# Patient Record
Sex: Female | Born: 1995 | Race: White | Hispanic: No | Marital: Single | Smoking: Current every day smoker
Health system: Southern US, Community
[De-identification: ages and names within clinical notes are randomized; demographics above are authoritative.]

---
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
## 2014-10-23 ENCOUNTER — Emergency Department (HOSPITAL_COMMUNITY): Payer: Managed Care, Other (non HMO)

## 2014-10-23 ENCOUNTER — Emergency Department (HOSPITAL_COMMUNITY)
Admission: EM | Admit: 2014-10-23 | Discharge: 2014-10-23 | Disposition: A | Discharge status: Home / Self Care | Payer: Managed Care, Other (non HMO) | Attending: Emergency Medicine | Admitting: Emergency Medicine

## 2014-10-23 ENCOUNTER — Encounter (HOSPITAL_COMMUNITY): Payer: Self-pay | Admitting: Emergency Medicine

## 2014-10-23 DIAGNOSIS — R1909 Other intra-abdominal and pelvic swelling, mass and lump: Secondary | ICD-10-CM

## 2014-10-23 DIAGNOSIS — K4090 Unilateral inguinal hernia, without obstruction or gangrene, not specified as recurrent: Secondary | ICD-10-CM | POA: Insufficient documentation

## 2014-10-23 DIAGNOSIS — Z720 Tobacco use: Secondary | ICD-10-CM | POA: Diagnosis not present

## 2014-10-23 DIAGNOSIS — Z3202 Encounter for pregnancy test, result negative: Secondary | ICD-10-CM | POA: Insufficient documentation

## 2014-10-23 LAB — URINALYSIS, ROUTINE W REFLEX MICROSCOPIC
Bilirubin Urine: NEGATIVE
Glucose, UA: NEGATIVE mg/dL
Hgb urine dipstick: NEGATIVE
Ketones, ur: NEGATIVE mg/dL
LEUKOCYTES UA: NEGATIVE
Nitrite: NEGATIVE
PROTEIN: NEGATIVE mg/dL
SPECIFIC GRAVITY, URINE: 1.015 (ref 1.005–1.030)
UROBILINOGEN UA: 0.2 mg/dL (ref 0.0–1.0)
pH: 7.5 (ref 5.0–8.0)

## 2014-10-23 LAB — BASIC METABOLIC PANEL
Anion gap: 7 (ref 5–15)
BUN: 6 mg/dL (ref 6–20)
CHLORIDE: 104 mmol/L (ref 101–111)
CO2: 26 mmol/L (ref 22–32)
Calcium: 9 mg/dL (ref 8.9–10.3)
Creatinine, Ser: 0.69 mg/dL (ref 0.44–1.00)
GFR calc non Af Amer: >60 mL/min (ref >60)
Glucose, Bld: 86 mg/dL (ref 65–99)
POTASSIUM: 3.7 mmol/L (ref 3.5–5.1)
SODIUM: 137 mmol/L (ref 135–145)

## 2014-10-23 LAB — CBC WITH DIFFERENTIAL/PLATELET
BASOS PCT: 0 %
Basophils Absolute: 0 10*3/uL (ref 0.0–0.1)
EOS ABS: 0.1 10*3/uL (ref 0.0–0.7)
Eosinophils Relative: 2 %
HEMATOCRIT: 43.1 % (ref 36.0–46.0)
HEMOGLOBIN: 15 g/dL (ref 12.0–15.0)
LYMPHS ABS: 2.4 10*3/uL (ref 0.7–4.0)
Lymphocytes Relative: 34 %
MCH: 33 pg (ref 26.0–34.0)
MCHC: 34.8 g/dL (ref 30.0–36.0)
MCV: 94.7 fL (ref 78.0–100.0)
MONOS PCT: 10 %
Monocytes Absolute: 0.7 10*3/uL (ref 0.1–1.0)
NEUTROS ABS: 3.8 10*3/uL (ref 1.7–7.7)
NEUTROS PCT: 54 %
Platelets: 254 10*3/uL (ref 150–400)
RBC: 4.55 MIL/uL (ref 3.87–5.11)
RDW: 12.4 % (ref 11.5–15.5)
WBC: 7 10*3/uL (ref 4.0–10.5)

## 2014-10-23 LAB — PREGNANCY, URINE: PREG TEST UR: NEGATIVE

## 2014-10-23 MED ORDER — IOHEXOL 300 MG/ML  SOLN
25.0000 mL | Freq: Once | INTRAMUSCULAR | Status: AC | PRN
  Administered 2014-10-23: 25 mL via ORAL

## 2014-10-23 MED ORDER — SODIUM CHLORIDE 0.9 % IV BOLUS (SEPSIS)
500.0000 mL | Freq: Once | INTRAVENOUS | Status: AC
  Administered 2014-10-23: 500 mL via INTRAVENOUS

## 2014-10-23 MED ORDER — ONDANSETRON HCL 4 MG/2ML IJ SOLN
4.0000 mg | Freq: Once | INTRAMUSCULAR | Status: AC
  Administered 2014-10-23: 4 mg via INTRAVENOUS
  Filled 2014-10-23: qty 2

## 2014-10-23 MED ORDER — MORPHINE SULFATE (PF) 4 MG/ML IV SOLN
4.0000 mg | Freq: Once | INTRAVENOUS | Status: AC
  Administered 2014-10-23: 4 mg via INTRAVENOUS
  Filled 2014-10-23: qty 1

## 2014-10-23 MED ORDER — OXYCODONE-ACETAMINOPHEN 5-325 MG PO TABS: 1.0000 | ORAL_TABLET | Freq: Four times a day (QID) | ORAL | Status: AC | PRN

## 2014-10-23 MED ORDER — IOHEXOL 300 MG/ML  SOLN
100.0000 mL | Freq: Once | INTRAMUSCULAR | Status: AC | PRN
  Administered 2014-10-23: 100 mL via INTRAVENOUS

## 2014-10-23 NOTE — ED Notes (Signed)
Pt's discharge instructions reviewed with pt and friend. Pt verbalized understanding, teach back method used. Prescriptions reviewed. All questions answered. Pt discharged home, VSS and pt is ambulatory.

## 2014-10-23 NOTE — ED Notes (Signed)
Know hernia for last five years but pain has increased over the last four days.  Rates pain 10/10.  Did not take any pain medication today.

## 2014-10-23 NOTE — Discharge Instructions (Signed)
Follow-up with general surgery. Phone number given. Medications for pain.

## 2014-10-23 NOTE — ED Notes (Signed)
Pt c/o increased left inguinal hernia  "tearing" pain. Pt reports she has had this hernia for 5 years and was told then to have surgery to fix it. Pt says it wasn't causing her any pain so therefore she didn't have the surgery. Reports increased swelling and pain in the left groin area for the past several days. Pt says she has been unable to sleep or lay down due to the pain. Pt reports sitting up helps her pain.

## 2014-10-23 NOTE — ED Provider Notes (Signed)
CSN: [id number]     Arrival date & time 10/23/14  1227 History  By signing my name below, I, Marica Otter, attest that this documentation has been prepared under the direction and in the presence of Donnetta Hutching, MD. Electronically Signed: Marica Otter, ED Scribe. 10/23/2014. 1:05 PM.   Chief Complaint  Patient presents with  . Hernia   HPI PCP: No PCP Per Patient HPI Comments: Holly Frey is a 19 y.o. female who presents to the Emergency Department complaining of chronic hernia originally onset 5 years ago with swelling and pain worsening over the past four days. Pt rates her current pain a 10/10. Pt [facility] Pt further notes she had a surgery consult 4 years ago to remove the hernia, however, because the hernia was asymptomatic at that time she did not have the surgery. Pt denies vomiting, change in appetite, Hx of incision of hernia site.  No past medical history on file. No past surgical history on file. No family history on file. Social History  Substance Use Topics  . Smoking status: Current Every Day Smoker -- 0.50 packs/day    Types: Cigarettes  . Smokeless tobacco: Not on file  . Alcohol Use: No   OB History    No data available     Review of Systems  Constitutional: Negative for fever and appetite change.  Gastrointestinal: Negative for vomiting.       Hernia in lower abd with associated pain     A complete 10 system review of systems was obtained and all systems are negative except as noted in the HPI and PMH.  Allergies  Review of patient's allergies indicates no known allergies.  Home Medications   Prior to Admission medications   Medication Sig Start Date End Date Taking? Authorizing Provider  levonorgestrel (MIRENA) 20 MCG/24HR IUD 1 each by Intrauterine route once.   Yes Historical Provider, MD  oxyCODONE-acetaminophen (PERCOCET/ROXICET) 5-325 MG tablet Take 1-2 tablets by mouth every 6 (six) hours as  needed. 10/23/14   Donnetta Hutching, MD   Triage Vitals: BP 132/101 mmHg  Pulse 65  Temp(Src) 98 F (36.7 C) (Oral)  Resp 14  Ht  (1.702 m)  Wt 140 lb (63.504 kg)  BMI 21.92 kg/m2  SpO2 100% Physical Exam  Constitutional: She is oriented to person, place, and time. She appears well-developed and well-nourished.  HENT:  Head: Normocephalic and atraumatic.  Eyes: Conjunctivae and EOM are normal. Pupils are equal, round, and reactive to light.  Neck: Normal range of motion. Neck supple.  Cardiovascular: Normal rate and regular rhythm.   Pulmonary/Chest: Effort normal and breath sounds normal.  Abdominal: Soft. Bowel sounds are normal.  Left suprapubic area 5-6cm indurated, tender mass.    Musculoskeletal: Normal range of motion.  Neurological: She is alert and oriented to person, place, and time.  Skin: Skin is warm and dry.  Psychiatric: She has a normal mood and affect. Her behavior is normal.  Nursing note and vitals reviewed.   ED Course  Procedures (including critical care time) DIAGNOSTIC STUDIES: Oxygen Saturation is 100% on ra, nl by my interpretation.    COORDINATION OF CARE: 12:53 PM: Discussed treatment plan which includes imaging with pt at bedside; patient verbalizes understanding and agrees with treatment plan.  Imaging Review Ct Abdomen Pelvis W Contrast  10/23/2014   CLINICAL DATA:  Pain in left inguinal region with history of hernia.  EXAM: CT ABDOMEN AND PELVIS  WITH CONTRAST  TECHNIQUE: Multidetector CT imaging of the abdomen and pelvis was performed using the standard protocol following bolus administration of intravenous contrast. Oral contrast was also administered.  CONTRAST:  25mL OMNIPAQUE IOHEXOL 300 MG/ML SOLN, OMNIPAQUE IOHEXOL 300 MG/ML SOLN  COMPARISON:  None.  FINDINGS: Lung bases are clear.  Liver is prominent, measuring 20.2 cm in length. No focal liver lesions are identified. Gallbladder wall is not appreciably thickened. There is no biliary  duct dilatation.  Spleen, pancreas, and adrenals appear normal.  There is no appreciable left kidney. Right kidney appears rather prominent, likely due to compensatory hypertrophy. No focal renal lesions are identified on the right. There is no right renal mass or hydronephrosis. There is no right-sided renal or ureteral calculus.  In the pelvis, that there is an intrauterine device positioned within the endometrium. Urinary bladder is midline with normal wall thickness. There is a small amount of free fluid in the cul-de-sac.  There is a left inguinal hernia which contains a lentiform shaped fluid-filled structure. This structure appear separate from bowel. This lentiform state structure measures approximately 11.0 x 8.3 x 5.3 cm. There is modest enhancement in a portion of this structure. This structure has a smooth wall without air or calcification. The bowel within the pelvis appears normal and is separate from this structure extending into the left inguinal hernia.  Appendix appears normal. There is no bowel obstruction. No free air or portal venous air. There is no demonstrable adenopathy or abscess in the abdomen or pelvis. The aorta appears unremarkable. There are no blastic or lytic bone lesions.  Elsewhere, there is  IMPRESSION: There is a left inguinal hernia. There is a complex predominantly fluid-filled structure extending to the inguinal hernia as described above. Question complex lymphocele or atypical mucocele as the most likely etiologies for this structure.  Small amount of free fluid in the cul-de-sac may be indicative of recent ovarian cyst rupture or may be a sympathetic response to the lesion in the left inguinal region.  There is an intrauterine device within the endometrium. The uterus is canted toward the right.  Appendix appears normal.  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 No right renal lesion.  Study otherwise unremarkable.  These results were  called by telephone at the time of interpretation on 10/23/2014 at 2:27 pm to Dr. Estell Harpin, ED physician, who verbally acknowledged these results.   Electronically Signed   By: Bretta Bang III M.D.   On: 10/23/2014 14:27   I have personally reviewed and evaluated these images as part of my medical decision-making.    MDM   Final diagnoses:  Left inguinal hernia  Mass of left inguinal region   No acute abdomen. Discussed with general surgeon Dr. Franky Macho. He will follow-up in the office. Discussed with patient and her family. Discharge medications Percocet. Patient advised to return for vomiting and dehydration  I personally performed the services described in this documentation, which was scribed in my presence. The recorded information has been reviewed and is accurate.    Donnetta Hutching, MD 10/23/14 [phone]

## 2016-07-15 ENCOUNTER — Ambulatory Visit: Payer: Self-pay | Admitting: Physician Assistant

## 2016-09-15 IMAGING — CT CT ABD-PELV W/ CM
2 of 4 series · 15 of 46 positions shown, 17 images · IV contrast (Omnipaque 300)
Comparison: None.

CLINICAL DATA: Pain in left inguinal region with history of hernia.

EXAM:
CT ABDOMEN AND PELVIS WITH CONTRAST
TECHNIQUE: Multidetector CT imaging of the abdomen and pelvis was performed
using the standard protocol following bolus administration of
intravenous contrast. Oral contrast was also administered.
CONTRAST:  25mL OMNIPAQUE IOHEXOL 300 MG/ML SOLN, 100mL OMNIPAQUE
IOHEXOL 300 MG/ML SOLN

[Series 2: abd_pel_with 5.0 b40f · axial · 0.68mm/px · z∈[-438,-18]mm · 12 of 92 slices shown, 14 images]
[im 4/92  soft-tissue]
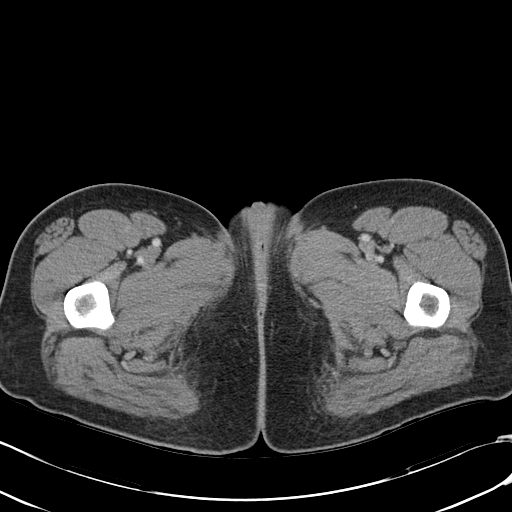
[im 4/92  bone]
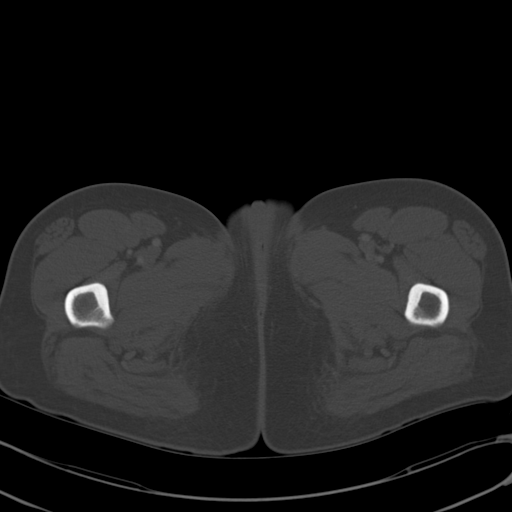
[im 12/92  soft-tissue]
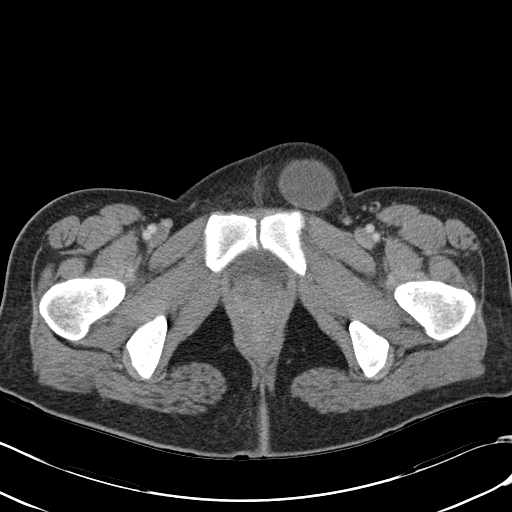
[im 20/92  soft-tissue]
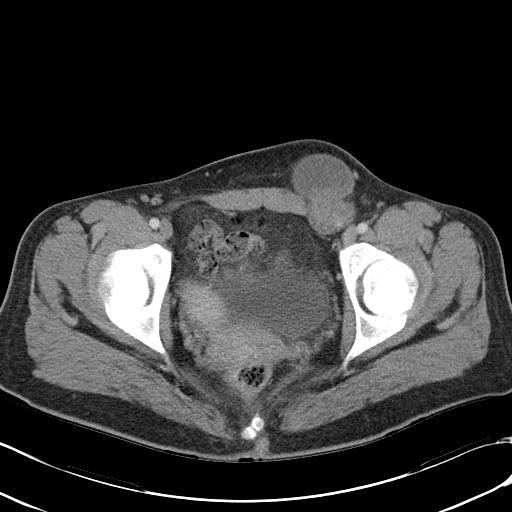
[im 28/92  soft-tissue]
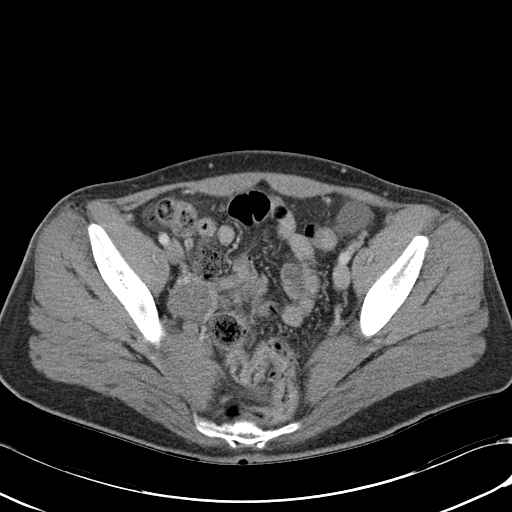
[im 36/92  soft-tissue]
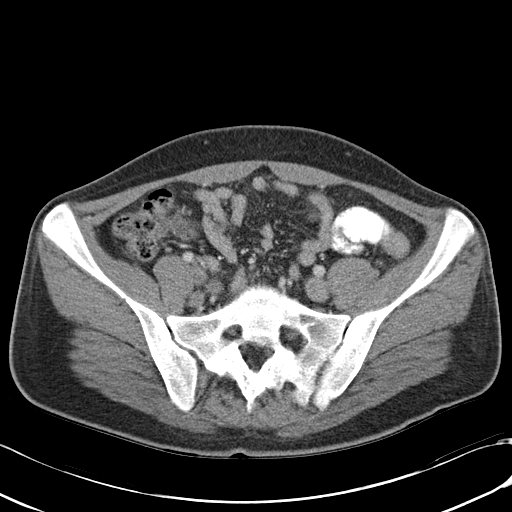
[im 44/92  soft-tissue]
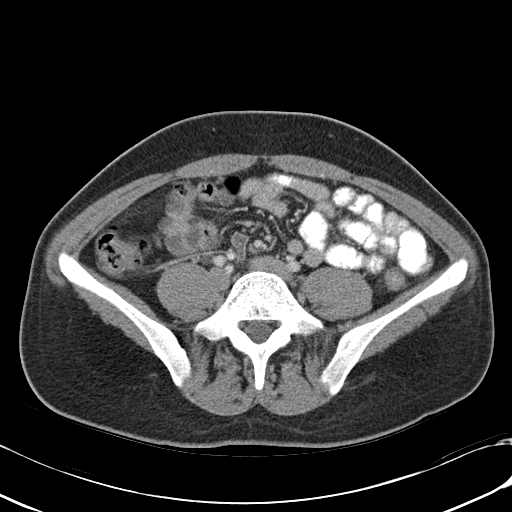
[im 48/92  soft-tissue]
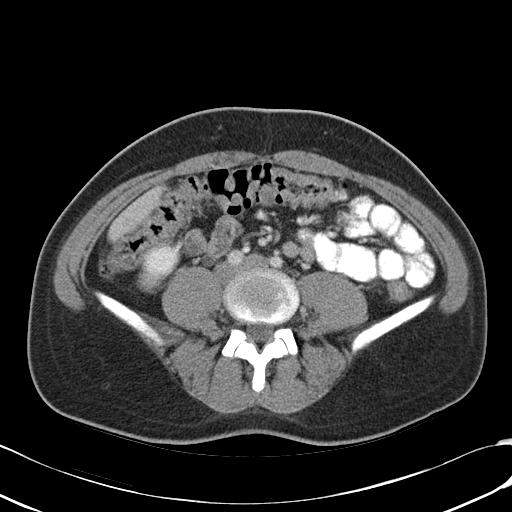
[im 56/92  soft-tissue]
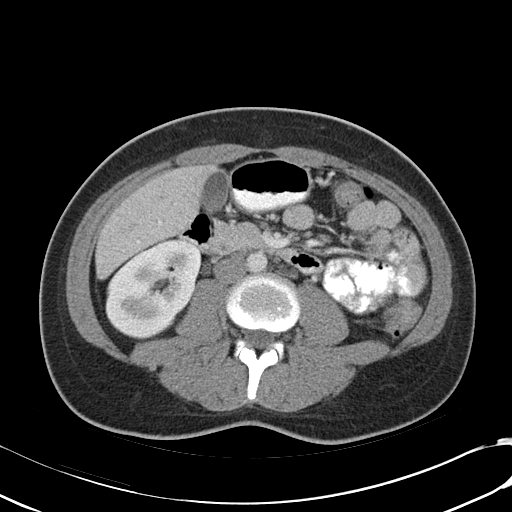
[im 64/92  soft-tissue]
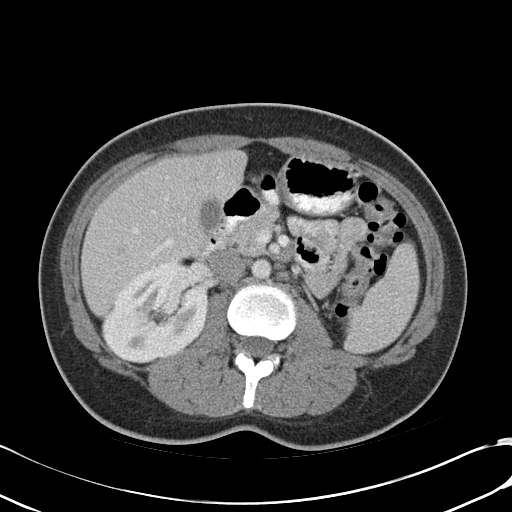
[im 64/92  bone]
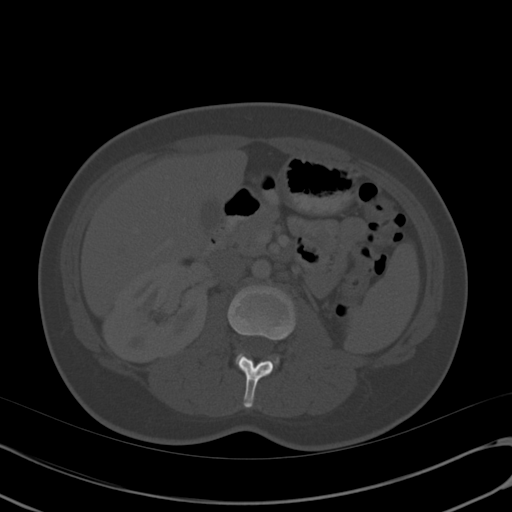
[im 72/92  soft-tissue]
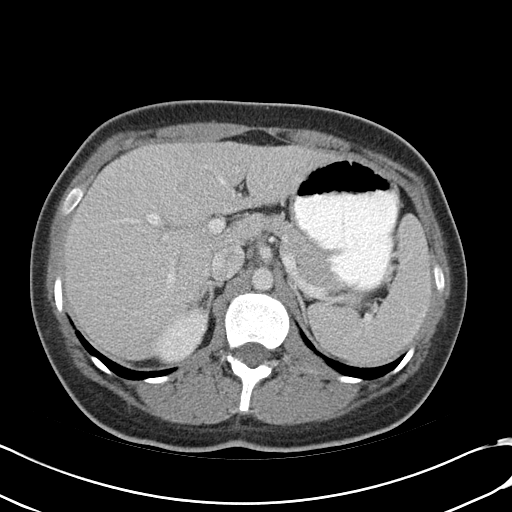
[im 80/92  soft-tissue]
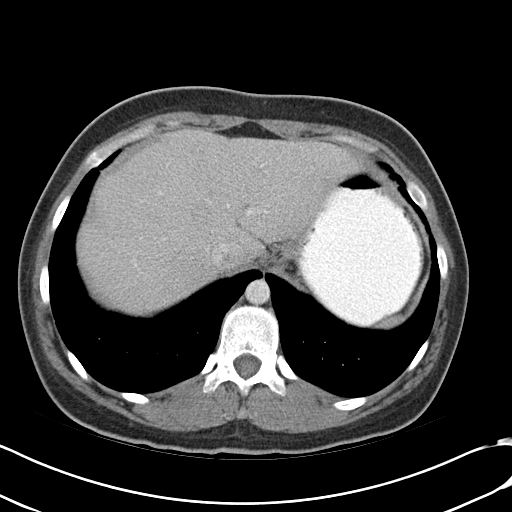
[im 88/92  soft-tissue]
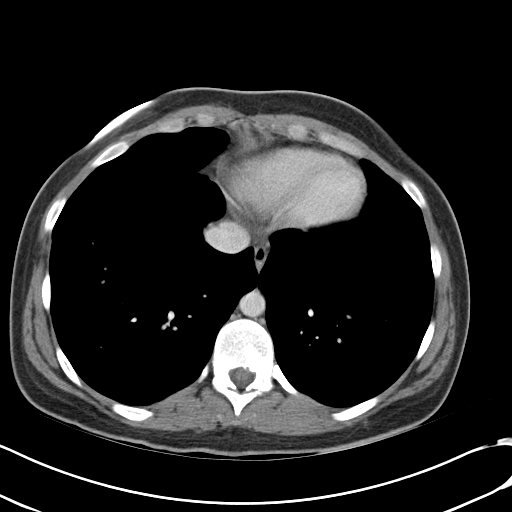

[Series 3: abd_pel_with 3.0 spo cor · coronal · 0.71mm/px · 3 of 71 slices shown]
[im 24/71  soft-tissue]
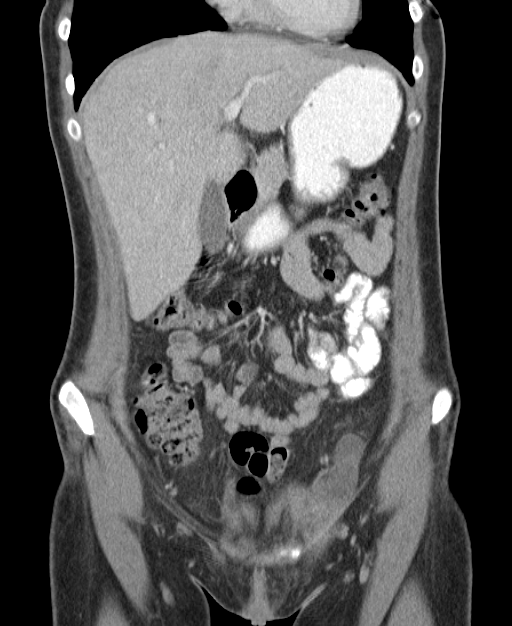
[im 32/71  soft-tissue]
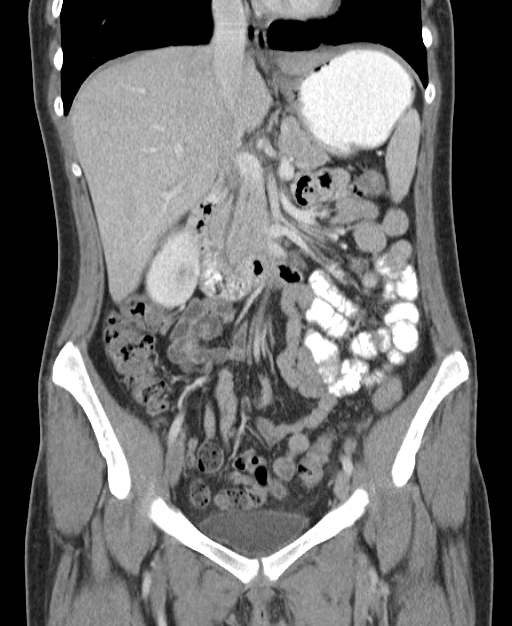
[im 39/71  soft-tissue]
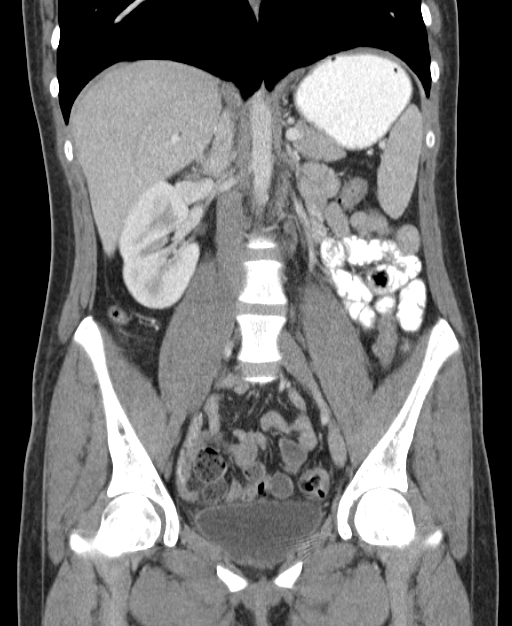

[15 of 46 positions shown; findings below may reference images not displayed]

FINDINGS: Lung bases are clear.

Liver is prominent, measuring 20.2 cm in length. No focal liver
lesions are identified. Gallbladder wall is not appreciably
thickened. There is no biliary duct dilatation.

Spleen, pancreas, and adrenals appear normal.

There is no appreciable left kidney. Right kidney appears rather
prominent, likely due to compensatory hypertrophy. No focal renal
lesions are identified on the right. There is no right renal mass or
hydronephrosis. There is no right-sided renal or ureteral calculus.

In the pelvis, that there is an intrauterine device positioned
within the endometrium. Urinary bladder is midline with normal wall
thickness. There is a small amount of free fluid in the cul-de-sac.

There is a left inguinal hernia which contains a lentiform shaped
fluid-filled structure. This structure appear separate from bowel.
This lentiform state structure measures approximately 11.0 x 8.3 x
5.3 cm. There is modest enhancement in a portion of this structure.
This structure has a smooth wall without air or calcification. The
bowel within the pelvis appears normal and is separate from this
structure extending into the left inguinal hernia.

Appendix appears normal. There is no bowel obstruction. No free air
or portal venous air. There is no demonstrable adenopathy or abscess
in the abdomen or pelvis. The aorta appears unremarkable. There are
no blastic or lytic bone lesions.

Elsewhere, there is
IMPRESSION: There is a left inguinal hernia. There is a complex predominantly
fluid-filled structure extending to the inguinal hernia as described
above. Question complex lymphocele or atypical mucocele as the most
likely etiologies for this structure.

Small amount of free fluid in the cul-de-sac may be indicative of
recent ovarian cyst rupture or may be a sympathetic response to the
lesion in the left inguinal region.

There is an intrauterine device within the endometrium. The uterus
is canted toward the right.

Appendix appears normal.

Left kidney absent, likely congenital. Compensatory hypertrophy of
right kidney. No right renal lesion.

Study otherwise unremarkable.

These results were called by telephone at the time of interpretation
on 10/23/2014 at [DATE] to Dr. Tip, [place] physician, who verbally
acknowledged these results.
